# Patient Record
Sex: Female | Born: 2000 | Race: White | Hispanic: No | Marital: Single | Smoking: Never smoker
Health system: Southern US, Community
[De-identification: ages and names within clinical notes are randomized; demographics above are authoritative.]

## PROBLEM LIST (undated history)

## (undated) DIAGNOSIS — N390 Urinary tract infection, site not specified: Secondary | ICD-10-CM

---

## 2019-03-23 ENCOUNTER — Encounter (HOSPITAL_COMMUNITY): Payer: Self-pay

## 2019-03-23 ENCOUNTER — Other Ambulatory Visit: Payer: Self-pay

## 2019-03-23 ENCOUNTER — Emergency Department (HOSPITAL_COMMUNITY)
Admission: EM | Admit: 2019-03-23 | Discharge: 2019-03-24 | Disposition: A | Discharge status: Home / Self Care | Payer: Medicaid - Out of State | Attending: Emergency Medicine | Admitting: Emergency Medicine

## 2019-03-23 DIAGNOSIS — O26833 Pregnancy related renal disease, third trimester: Secondary | ICD-10-CM | POA: Diagnosis present

## 2019-03-23 DIAGNOSIS — O2310 Infections of bladder in pregnancy, unspecified trimester: Secondary | ICD-10-CM | POA: Diagnosis not present

## 2019-03-23 DIAGNOSIS — O4103X0 Oligohydramnios, third trimester, not applicable or unspecified: Secondary | ICD-10-CM

## 2019-03-23 DIAGNOSIS — R310 Gross hematuria: Secondary | ICD-10-CM

## 2019-03-23 DIAGNOSIS — N3090 Cystitis, unspecified without hematuria: Secondary | ICD-10-CM

## 2019-03-23 DIAGNOSIS — Z3A29 29 weeks gestation of pregnancy: Secondary | ICD-10-CM

## 2019-03-23 DIAGNOSIS — Z3A28 28 weeks gestation of pregnancy: Secondary | ICD-10-CM | POA: Diagnosis not present

## 2019-03-23 DIAGNOSIS — N939 Abnormal uterine and vaginal bleeding, unspecified: Secondary | ICD-10-CM

## 2019-03-23 HISTORY — DX: Urinary tract infection, site not specified: N39.0

## 2019-03-23 LAB — URINALYSIS, ROUTINE W REFLEX MICROSCOPIC
Bilirubin Urine: NEGATIVE
Glucose, UA: NEGATIVE mg/dL
Ketones, ur: NEGATIVE mg/dL
Nitrite: NEGATIVE
Protein, ur: NEGATIVE mg/dL
RBC / HPF: >50 RBC/hpf — ABNORMAL HIGH (ref 0–5)
Specific Gravity, Urine: 1.01 (ref 1.005–1.030)
pH: 7 (ref 5.0–8.0)

## 2019-03-23 NOTE — ED Notes (Signed)
PT ENDORSES NO CONTRACTION/CRAMPING LIKE FEELING. NO ACTIVE BLEEDING. FEELS LIKE UTI. PT IN NO ACUTE DISTRESS

## 2019-03-23 NOTE — ED Triage Notes (Signed)
Pt states she is [redacted] weeks pregnant. Pt states she has noticed blood when she wipes. Pt is unsure if it is from her vagina or urethra. Pt has hx of recurrent UTIs.

## 2019-03-23 NOTE — ED Notes (Signed)
PT ENDORSES SHE IS GOING TO SIT OUTSIDE FOR A FEW MINUTES. UNDERSTANDS SHE MUST BE PRESENT FOR TRIAGE RN EMT WHEN CALLED. REVIEWED PLAN OF CARE. VERBALIZED UNDERSTANDING.

## 2019-03-24 ENCOUNTER — Emergency Department (HOSPITAL_COMMUNITY): Payer: Medicaid - Out of State

## 2019-03-24 ENCOUNTER — Telehealth: Payer: Self-pay | Admitting: Obstetrics & Gynecology

## 2019-03-24 LAB — I-STAT CHEM 8, ED
BUN: <3 mg/dL — ABNORMAL LOW (ref 6–20)
Calcium, Ion: 1.02 mmol/L — ABNORMAL LOW (ref 1.15–1.40)
Chloride: 105 mmol/L (ref 98–111)
Creatinine, Ser: 0.4 mg/dL — ABNORMAL LOW (ref 0.44–1.00)
Glucose, Bld: 74 mg/dL (ref 70–99)
HCT: 32 % — ABNORMAL LOW (ref 36.0–46.0)
Hemoglobin: 10.9 g/dL — ABNORMAL LOW (ref 12.0–15.0)
Potassium: 3.7 mmol/L (ref 3.5–5.1)
Sodium: 135 mmol/L (ref 135–145)
TCO2: 22 mmol/L (ref 22–32)

## 2019-03-24 LAB — URINE CULTURE: Culture: NO GROWTH

## 2019-03-24 LAB — WET PREP, GENITAL
Clue Cells Wet Prep HPF POC: NONE SEEN
Sperm: NONE SEEN
Trich, Wet Prep: NONE SEEN
WBC, Wet Prep HPF POC: NONE SEEN
Yeast Wet Prep HPF POC: NONE SEEN

## 2019-03-24 LAB — CBC WITH DIFFERENTIAL/PLATELET
Abs Immature Granulocytes: 0.06 10*3/uL (ref 0.00–0.07)
Basophils Absolute: 0 10*3/uL (ref 0.0–0.1)
Basophils Relative: 0 %
Eosinophils Absolute: 0.1 10*3/uL (ref 0.0–0.5)
Eosinophils Relative: 0 %
HCT: 33.2 % — ABNORMAL LOW (ref 36.0–46.0)
Hemoglobin: 11 g/dL — ABNORMAL LOW (ref 12.0–15.0)
Immature Granulocytes: 0 %
Lymphocytes Relative: 15 %
Lymphs Abs: 2.1 10*3/uL (ref 0.7–4.0)
MCH: 30.8 pg (ref 26.0–34.0)
MCHC: 33.1 g/dL (ref 30.0–36.0)
MCV: 93 fL (ref 80.0–100.0)
Monocytes Absolute: 0.7 10*3/uL (ref 0.1–1.0)
Monocytes Relative: 5 %
Neutro Abs: 10.9 10*3/uL — ABNORMAL HIGH (ref 1.7–7.7)
Neutrophils Relative %: 80 %
Platelets: 275 10*3/uL (ref 150–400)
RBC: 3.57 MIL/uL — ABNORMAL LOW (ref 3.87–5.11)
RDW: 12.3 % (ref 11.5–15.5)
WBC: 13.8 10*3/uL — ABNORMAL HIGH (ref 4.0–10.5)
nRBC: 0 % (ref 0.0–0.2)

## 2019-03-24 LAB — ABO/RH: ABO/RH(D): AB NEG

## 2019-03-24 MED ORDER — CEPHALEXIN 500 MG PO CAPS: ORAL_CAPSULE | ORAL | 0 refills | Status: AC

## 2019-03-24 MED ORDER — RHO D IMMUNE GLOBULIN 1500 UNIT/2ML IJ SOSY
300.0000 ug | PREFILLED_SYRINGE | Freq: Once | INTRAMUSCULAR | Status: AC
  Administered 2019-03-24: 300 ug via INTRAMUSCULAR
  Filled 2019-03-24: qty 2

## 2019-03-24 MED ORDER — AZITHROMYCIN 1 G PO PACK
1.0000 g | PACK | Freq: Once | ORAL | Status: AC
  Administered 2019-03-24: 02:00:00 1 g via ORAL
  Filled 2019-03-24: qty 1

## 2019-03-24 MED ORDER — ACETAMINOPHEN 500 MG PO TABS
1000.0000 mg | ORAL_TABLET | Freq: Once | ORAL | Status: AC
  Administered 2019-03-24: 1000 mg via ORAL
  Filled 2019-03-24: qty 2

## 2019-03-24 MED ORDER — STERILE WATER FOR INJECTION IJ SOLN
INTRAMUSCULAR | Status: AC
  Administered 2019-03-24: 02:00:00 0.9 mL
  Filled 2019-03-24: qty 10

## 2019-03-24 MED ORDER — CEFTRIAXONE SODIUM 250 MG IJ SOLR
250.0000 mg | Freq: Once | INTRAMUSCULAR | Status: AC
  Administered 2019-03-24: 250 mg via INTRAMUSCULAR
  Filled 2019-03-24: qty 250

## 2019-03-24 NOTE — Telephone Encounter (Signed)
Attempted to call patient with her ultrasound appointment (10/13 @ 1:30). No answer, left voicemail with the appointment information. Patient instructed to give MFM a call if she is needing to reschedule the appointment. Number left.

## 2019-03-24 NOTE — ED Provider Notes (Addendum)
[place] DEPT Provider Note   CSN: [id number] Arrival date & time: 03/23/19  1742     History   Chief Complaint Chief Complaint  Patient presents with  . Hematuria    HPI Kimberly Faulkner is a 18 y.o. female.     The history is provided by the patient.  Hematuria This is a new problem. The current episode started yesterday. The problem occurs constantly. The problem has not changed since onset.Pertinent negatives include no chest pain, no abdominal pain, no headaches and no shortness of breath. Nothing aggravates the symptoms. Nothing relieves the symptoms. She has tried nothing for the symptoms. The treatment provided no relief.  Patient is G2 P0 at 28 weeks by LMP presenting with hematuria. Blood on the tissue with wiping and urine is dark.  No f/c/r.  No flank pain.  No gush of fluid no passage of mucus plug,  No trauma.  Last intercourse was 3 days ago.   No new partners.    Past Medical History:  Diagnosis Date  . Recurrent UTI     There are no active problems to display for this patient.   History reviewed. No pertinent surgical history.   OB History    Gravida  1   Para      Term      Preterm      AB      Living        SAB      TAB      Ectopic      Multiple      Live Births               Home Medications    Prior to Admission medications   Medication Sig Start Date End Date Taking? Authorizing Provider  Prenatal Vit-Fe Fumarate-FA (MULTIVITAMIN-PRENATAL) 27-0.8 MG TABS tablet Take 1 tablet by mouth daily at 12 noon.   Yes [provider]    Family History No family history on file.  Social History Social History   Tobacco Use  . Smoking status: Never Smoker  Substance Use Topics  . Alcohol use: Never    Frequency: Never  . Drug use: Never     Allergies   Patient has no known allergies.   Review of Systems Review of Systems  Constitutional: Negative for fever.  HENT: Negative for  congestion.   Eyes: Negative for visual disturbance.  Respiratory: Negative for cough and shortness of breath.   Cardiovascular: Negative for chest pain.  Gastrointestinal: Negative for abdominal pain.  Genitourinary: Positive for hematuria. Negative for decreased urine volume, dyspareunia, dysuria, flank pain, genital sores, urgency, vaginal bleeding and vaginal discharge.  Musculoskeletal: Negative for back pain.  Neurological: Negative for weakness and headaches.     Physical Exam Updated Vital Signs BP 113/61 (BP Location: Left Arm)   Pulse 72   Temp 99.5 F (37.5 C) (Oral)   Resp 14   Wt 65.3 kg   SpO2 100%   Physical Exam Vitals signs and nursing note reviewed. Exam conducted with a chaperone present.  Constitutional:      General: She is not in acute distress.    Appearance: She is normal weight.  HENT:     Head: Normocephalic and atraumatic.     Nose: Nose normal.  Eyes:     Conjunctiva/sclera: Conjunctivae normal.     Pupils: Pupils are equal, round, and reactive to light.  Neck:     Musculoskeletal: Normal range of  motion and neck supple.  Cardiovascular:     Rate and Rhythm: Normal rate and regular rhythm.     Pulses: Normal pulses.     Heart sounds: Normal heart sounds.  Pulmonary:     Effort: Pulmonary effort is normal.     Breath sounds: Normal breath sounds.  Abdominal:     General: Abdomen is flat.     Tenderness: There is no abdominal tenderness. There is no guarding or rebound.     Hernia: No hernia is present.  Genitourinary:    General: Normal vulva.     Vagina: Vaginal discharge present.     Cervix: Discharge present. No cervical motion tenderness.     Comments: Chaperone present.  Gravid os closed and high thick green discharge.   Musculoskeletal: Normal range of motion.  Skin:    General: Skin is warm and dry.     Capillary Refill: Capillary refill takes less than 2 seconds.  Neurological:     General: No focal deficit present.     Mental  Status: She is alert and oriented to person, place, and time.  Psychiatric:        Behavior: Behavior normal.      ED Treatments / Results  Labs (all labs ordered are listed, but only abnormal results are displayed) Labs Reviewed  URINALYSIS, ROUTINE W REFLEX MICROSCOPIC - Abnormal; Notable for the following components:      Result Value   APPearance HAZY (*)    Hgb urine dipstick LARGE (*)    Leukocytes,Ua SMALL (*)    RBC / HPF >50 (*)    Bacteria, UA RARE (*)    All other components within normal limits  WET PREP, GENITAL  URINE CULTURE  CBC WITH DIFFERENTIAL/PLATELET  I-STAT CHEM 8, ED  ABO/RH  GC/CHLAMYDIA PROBE AMP ([place]) NOT AT [facility]    EKG None  Radiology US Ob Limited  Result Date: 03/24/2019 CLINICAL DATA:  Vaginal bleeding EXAM: LIMITED OBSTETRIC ULTRASOUND FINDINGS: Number of Fetuses: 1 Heart Rate:  142 bpm Movement: Yes Presentation: Cephalic Placental Location: Posterior Previa: No Amniotic Fluid (Subjective):  Appears decreased. BPD: 7.27 cm 29 w  1 d MATERNAL FINDINGS: Cervix:  Appears closed. Uterus/Adnexae: No abnormality visualized. IMPRESSION: 1. Single viable intrauterine pregnancy as above. Negative for previa or retroplacental hematoma/fluid collection. 2. Amniotic fluid quantity appears decreased. This exam is performed on an emergent basis and does not comprehensively evaluate fetal size, dating, or anatomy; follow-up complete OB US should be considered if further fetal assessment is warranted. Electronically Signed   By: Jasmine Pang M.D.   On: 03/24/2019 00:47    Procedures Procedures (including critical care time)  Medications Ordered in ED Medications  acetaminophen (TYLENOL) tablet 1,000 mg (1,000 mg Oral Given 03/24/19 0158)  rho (d) immune globulin (RHIG/RHOPHYLAC) injection 300 mcg (300 mcg Intramuscular Given 03/24/19 0222)  cefTRIAXone (ROCEPHIN) injection 250 mg (250 mg Intramuscular Given 03/24/19 0157)  azithromycin (ZITHROMAX)  powder 1 g (1 g Oral Given 03/24/19 0158)  sterile water (preservative free) injection (0.9 mLs  Given 03/24/19 0200)     Initial Impression / Assessment and Plan / ED Course  Case d/w Dr. Vicente Serene who will set up repeat US and appointment with MFM.  Patient to be called.  Treat for gonorrhea and chlamydia.  Give rhogam in ED.    Will also treat for UTI as this is when she wipes and there are bacteria in urine.  Will have patient follow up with urology  as an outpatient.    Will advise pelvic rest and if any further problems [facility].    Patient is well appearing and I doubt kidney stone and will not expose mother and baby to needless radiation.    Reata Larita FifeLynn was evaluated in Emergency Department on 03/24/2019 for the symptoms described in the history of present illness. She was evaluated in the context of the global COVID-19 pandemic, which necessitated consideration that the patient might be at risk for infection with the SARS-CoV-2 virus that causes COVID-19. Institutional protocols and algorithms that pertain to the evaluation of patients at risk for COVID-19 are in a state of rapid change based on information released by regulatory bodies including the CDC and federal and state organizations. These policies and algorithms were followed during the patient's care in the ED.  Final Clinical Impressions(s) / ED Diagnoses   Final diagnoses:  Vaginal bleeding    Return for intractable cough, coughing up blood,fevers >100.4 unrelieved by medication, shortness of breath, intractable vomiting, chest pain, shortness of breath, weakness,numbness, changes in speech, facial asymmetry,abdominal pain, passing out,Inability to tolerate liquids or food, cough, altered mental status or any concerns. No signs of systemic illness or infection. The patient is nontoxic-appearing on exam and vital signs are within normal limits.   I have reviewed the triage vital signs and the nursing notes.  Pertinent labs &imaging results that were available during my care of the patient were reviewed by me and considered in my medical decision making (see chart for details).After history, exam, and medical workup I feel the patient has beenappropriately medically screened and is safe for discharge home. Pertinent diagnoses were discussed with the patient. Patient was given return precautions.      Lataja Newland, MD 03/24/19 [id number]    Taegan Haider, MD 03/24/19 [id number]

## 2019-03-25 LAB — RH IG WORKUP (INCLUDES ABO/RH)
ABO/RH(D): AB NEG
Antibody Screen: NEGATIVE
Gestational Age(Wks): 28
Unit division: 0
Unit tag comment: 28

## 2019-03-25 LAB — GC/CHLAMYDIA PROBE AMP (~~LOC~~) NOT AT ARMC
Chlamydia: NEGATIVE
Neisseria Gonorrhea: NEGATIVE

## 2019-03-31 ENCOUNTER — Other Ambulatory Visit (HOSPITAL_COMMUNITY): Payer: Medicaid - Out of State

## 2020-05-16 IMAGING — US US OB LIMITED
1 series · 14 of 28 positions shown · non-contrast
Comparison: none

CLINICAL DATA: Vaginal bleeding

EXAM:
LIMITED OBSTETRIC ULTRASOUND

[Series 1: us ob limited · 14 of 44 slices shown]
[im 2/44]
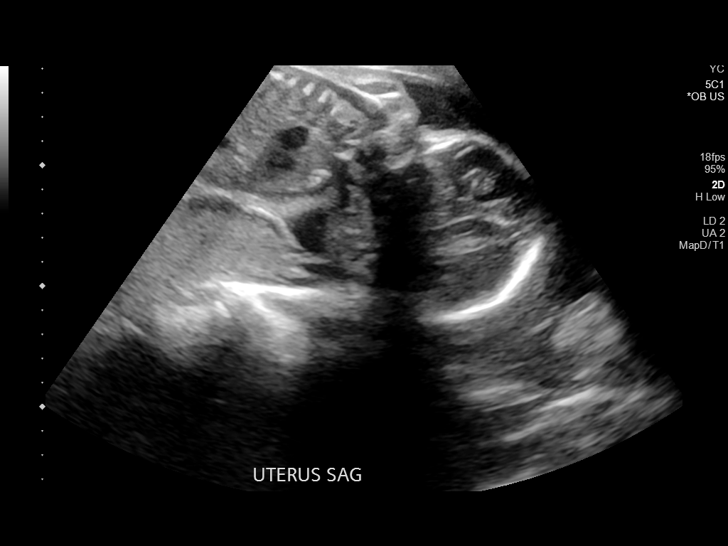
[im 5/44]
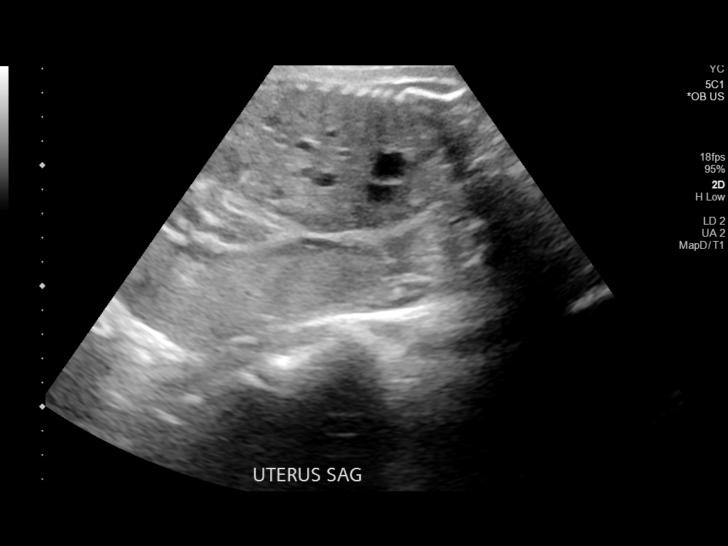
[im 8/44]
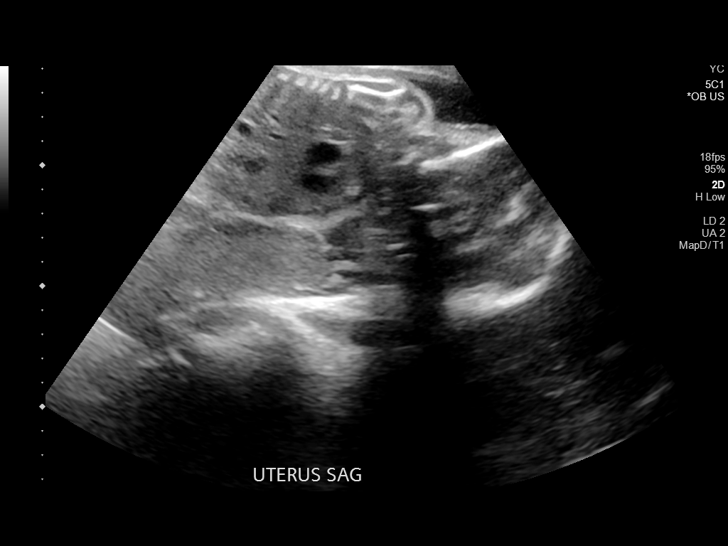
[im 12/44]
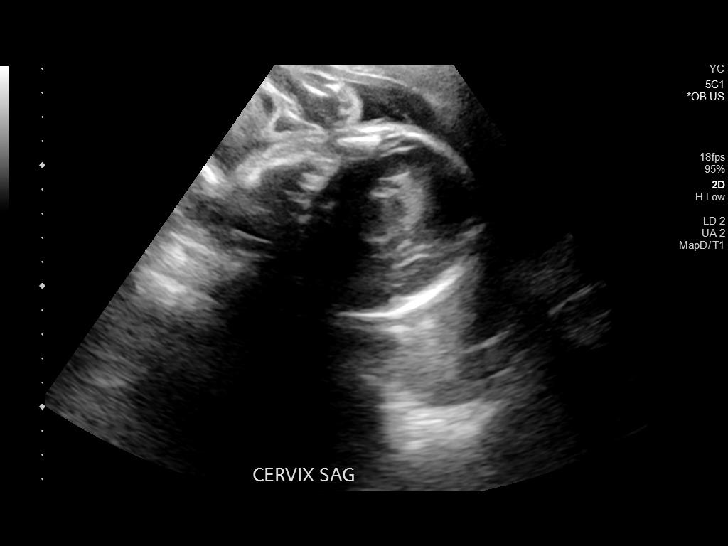
[im 15/44]
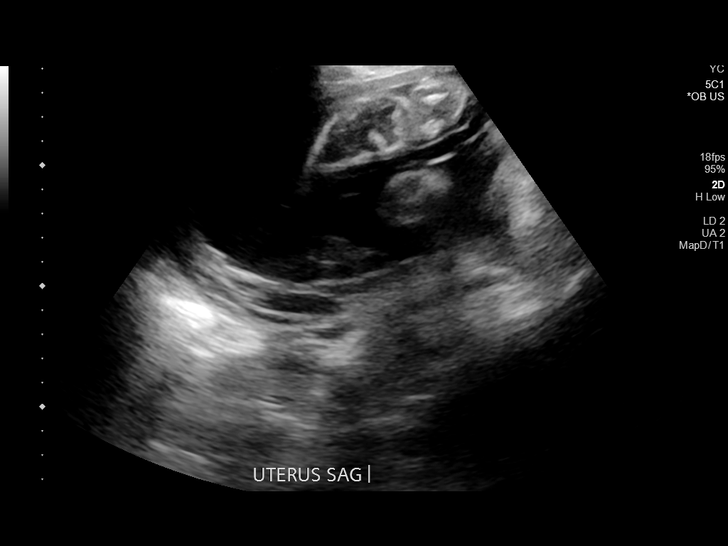
[im 18/44]
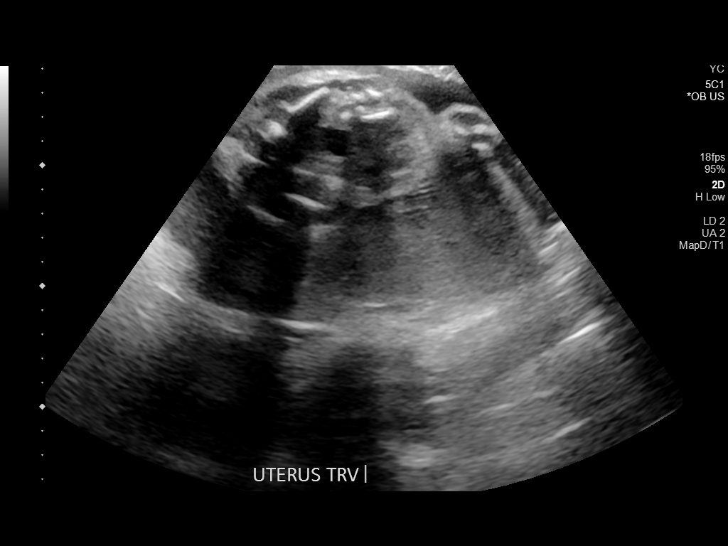
[im 21/44]
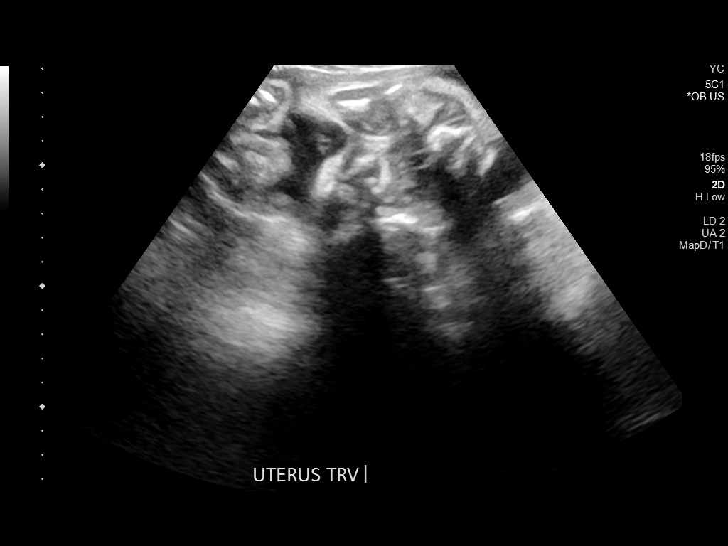
[im 24/44]
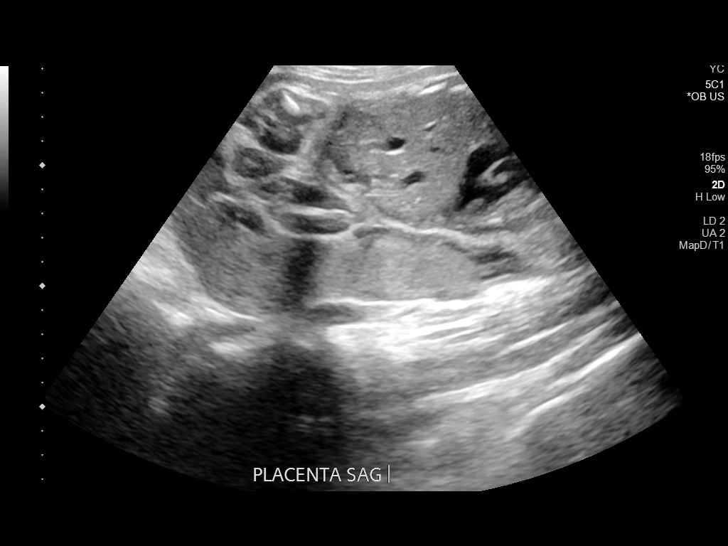
[im 28/44]
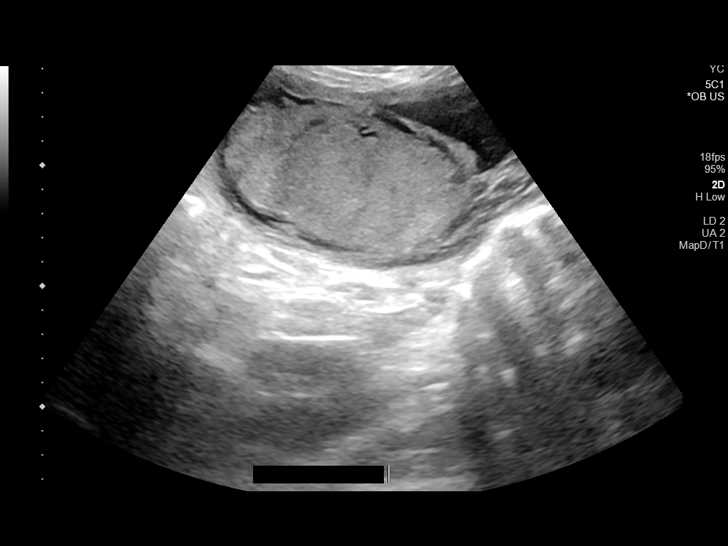
[im 31/44]
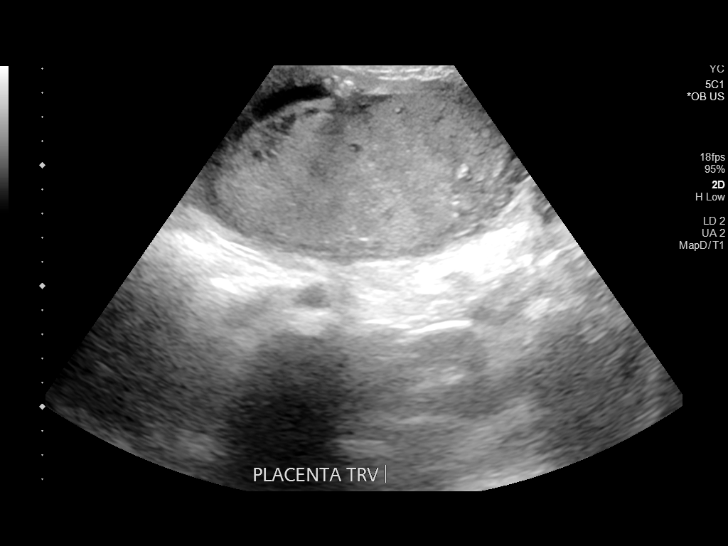
[im 34/44]
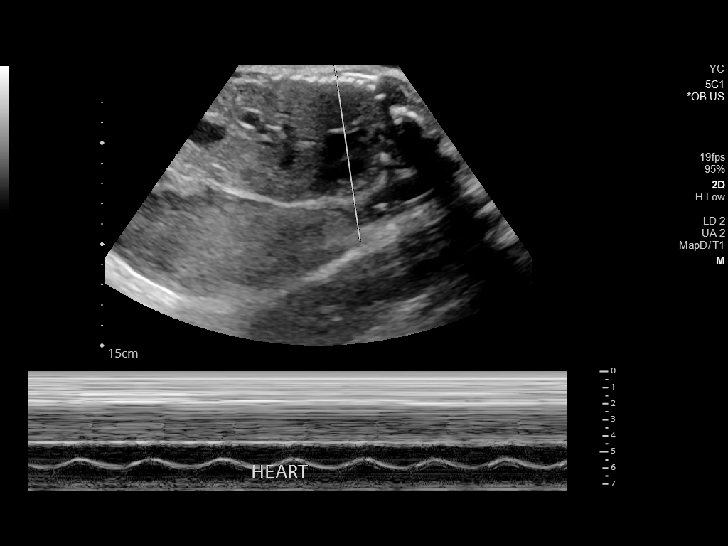
[im 37/44]
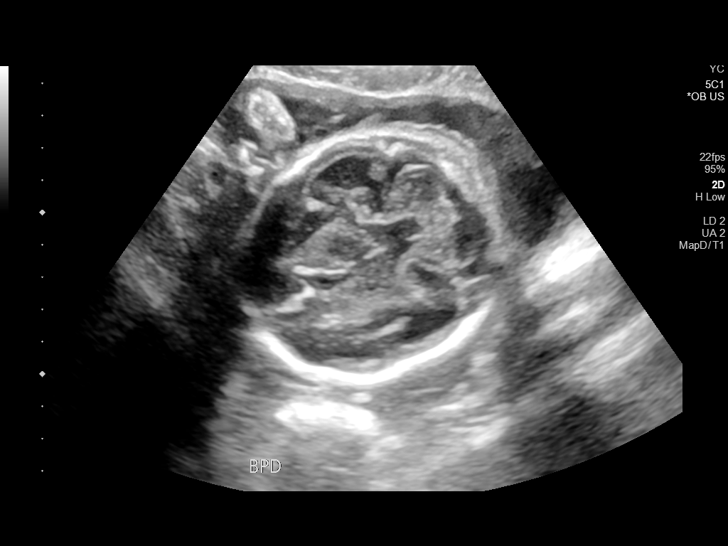
[im 40/44]
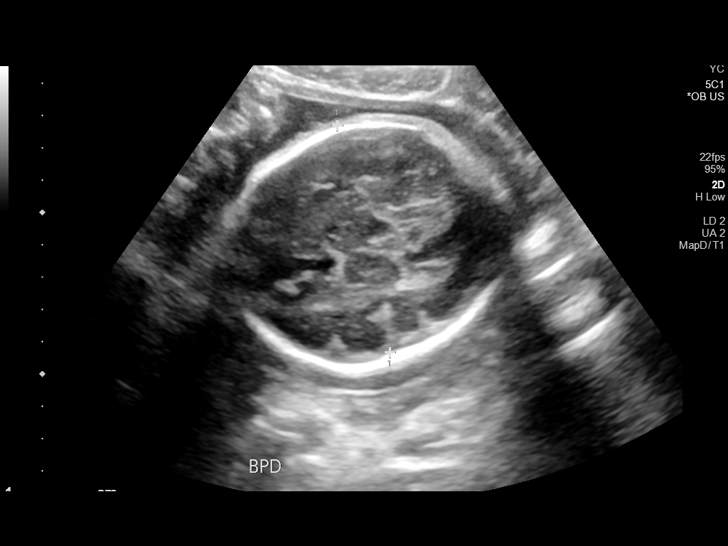
[im 44/44]
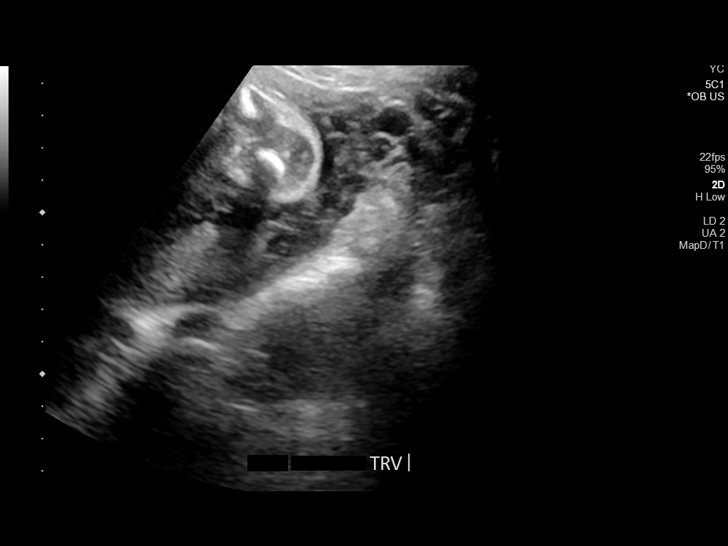

[14 of 28 positions shown; findings below may reference images not displayed]

FINDINGS: Number of Fetuses: 1

Heart Rate:  142 bpm

Movement: Yes

Presentation: Cephalic

Placental Location: Posterior

Previa: No

Amniotic Fluid (Subjective):  Appears decreased.

BPD: 7.27 cm 29 w  1 d

MATERNAL FINDINGS:

Cervix:  Appears closed.

Uterus/Adnexae: No abnormality visualized.
IMPRESSION: 1. Single viable intrauterine pregnancy as above. Negative for
previa or retroplacental hematoma/fluid collection.
2. Amniotic fluid quantity appears decreased.

This exam is performed on an emergent basis and does not
comprehensively evaluate fetal size, dating, or anatomy; follow-up
complete OB US should be considered if further fetal assessment is
warranted.
